# Patient Record
Sex: Female | Born: 1960 | Race: White | Hispanic: No | Marital: Married | Smoking: Former smoker
Health system: Southern US, Community
[De-identification: ages and names within clinical notes are randomized; demographics above are authoritative.]

## PROBLEM LIST (undated history)

## (undated) DIAGNOSIS — D229 Melanocytic nevi, unspecified: Secondary | ICD-10-CM

## (undated) DIAGNOSIS — N951 Menopausal and female climacteric states: Secondary | ICD-10-CM

## (undated) HISTORY — PX: OTHER SURGICAL HISTORY: SHX169

## (undated) HISTORY — DX: Menopausal and female climacteric states: N95.1

## (undated) HISTORY — DX: Melanocytic nevi, unspecified: D22.9

## (undated) HISTORY — PX: BREAST SURGERY: SHX581

## (undated) HISTORY — PX: BUNIONETTE EXCISION: SUR500

---

## 2005-08-11 ENCOUNTER — Encounter: Admission: RE | Admit: 2005-08-11 | Discharge: 2005-08-11 | Payer: Self-pay | Admitting: Internal Medicine

## 2006-08-22 ENCOUNTER — Ambulatory Visit (HOSPITAL_COMMUNITY): Admission: RE | Admit: 2006-08-22 | Discharge: 2006-08-22 | Payer: Self-pay | Admitting: *Deleted

## 2006-10-26 ENCOUNTER — Emergency Department (HOSPITAL_COMMUNITY): Admission: EM | Admit: 2006-10-26 | Discharge: 2006-10-26 | Payer: Self-pay | Admitting: Emergency Medicine

## 2007-01-02 ENCOUNTER — Encounter: Admission: RE | Admit: 2007-01-02 | Discharge: 2007-01-02 | Payer: Self-pay | Admitting: Obstetrics and Gynecology

## 2007-10-15 ENCOUNTER — Ambulatory Visit: Payer: Self-pay | Admitting: Oncology

## 2008-05-07 ENCOUNTER — Encounter: Admission: RE | Admit: 2008-05-07 | Discharge: 2008-05-07 | Payer: Self-pay | Admitting: Obstetrics and Gynecology

## 2010-03-11 ENCOUNTER — Encounter: Admission: RE | Admit: 2010-03-11 | Discharge: 2010-03-11 | Payer: Self-pay | Admitting: Obstetrics and Gynecology

## 2011-01-23 IMAGING — MG MM DIGITAL SCREENING W/ IMPLANTS
8 series · 8 of 8 positions shown · non-contrast
Comparison: none

DG SCREENING W/IMPLANTS
Bilateral CC and MLO view(s) were taken.

DIGITAL SCREENING MAMMOGRAM W/IMPLANTS WITH CAD:
Implants are present in a subpectoral location.  Standard and modified compression views are 
obtained.
The breast tissue is heterogeneously dense.  No dominant masses or malignant type calcifications 
are identified.  Compared with prior studies.
Images were processed with CAD.

[R CC]
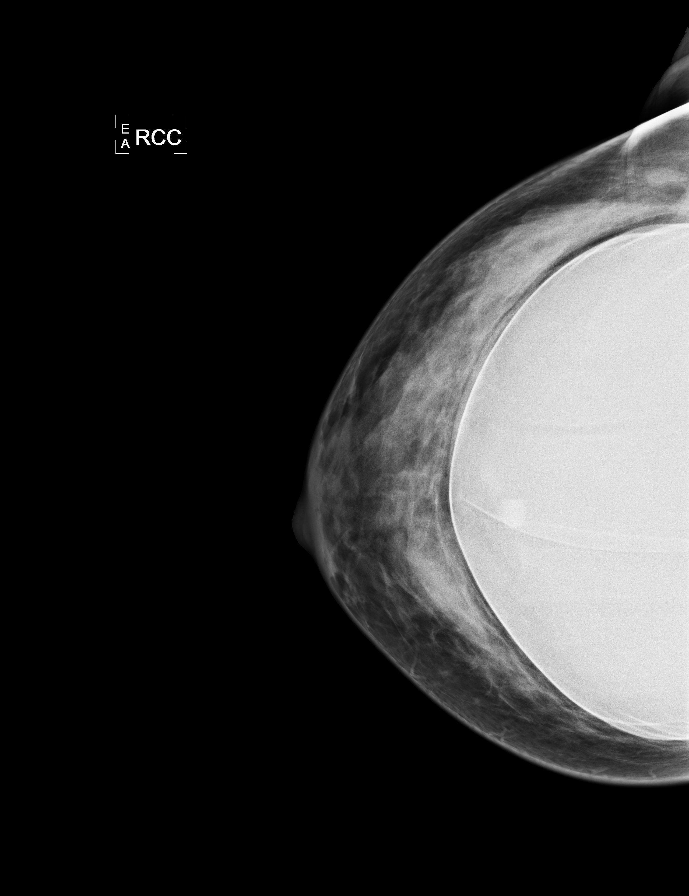

[L CC]
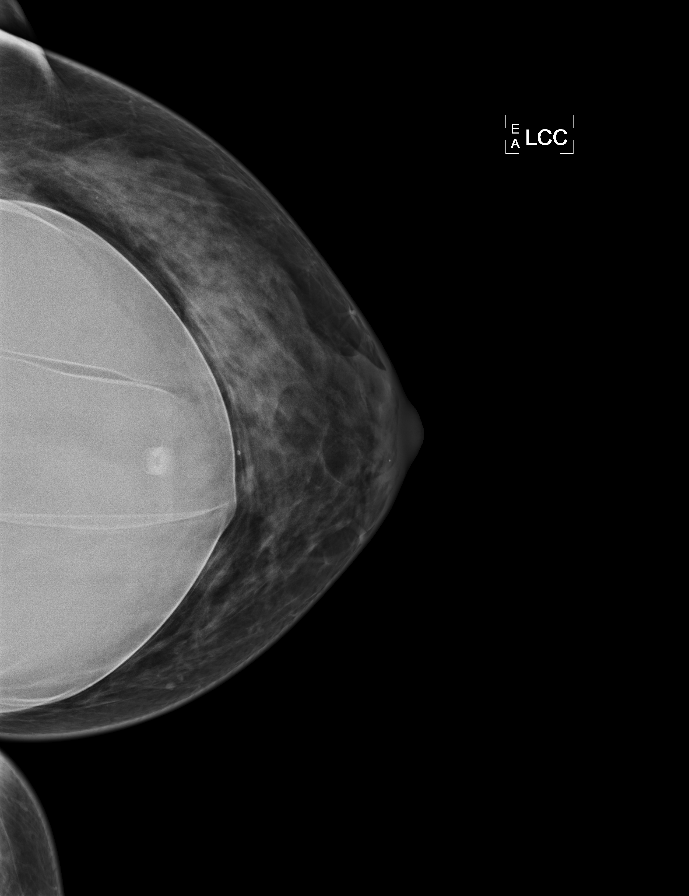

[L MLO]
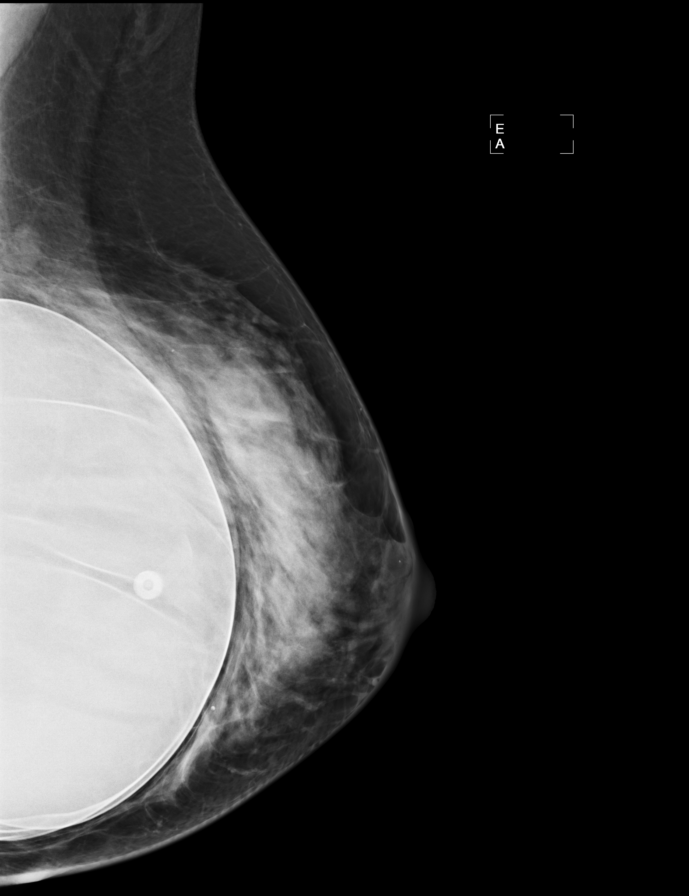

[R MLO]
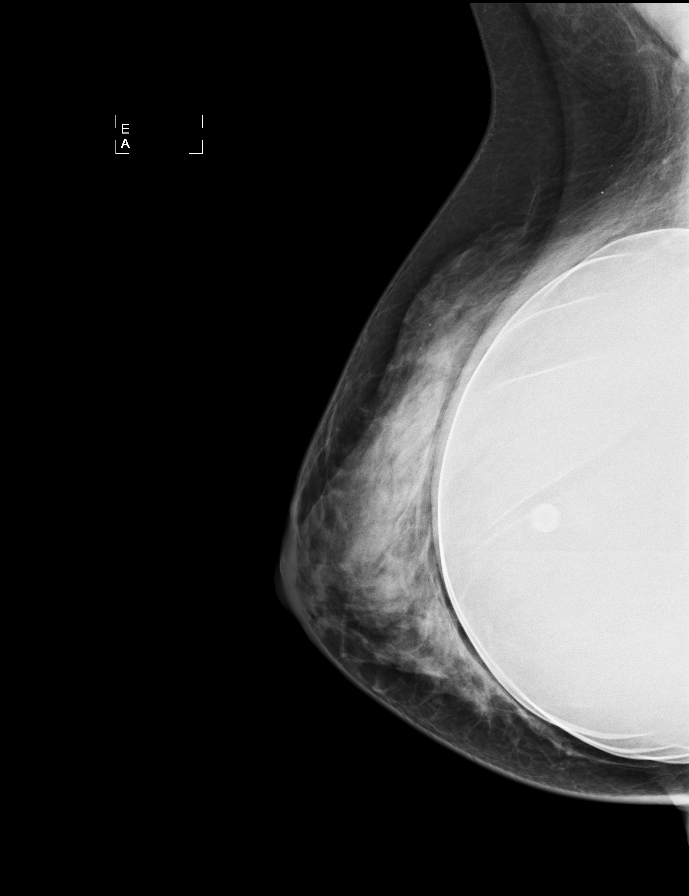

[R CCID]
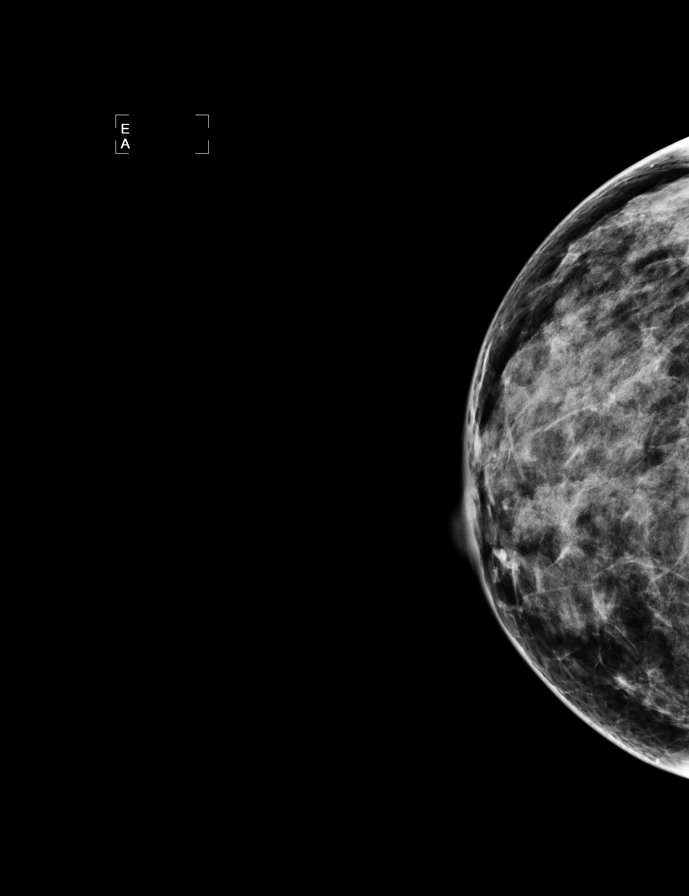

[L CCID]
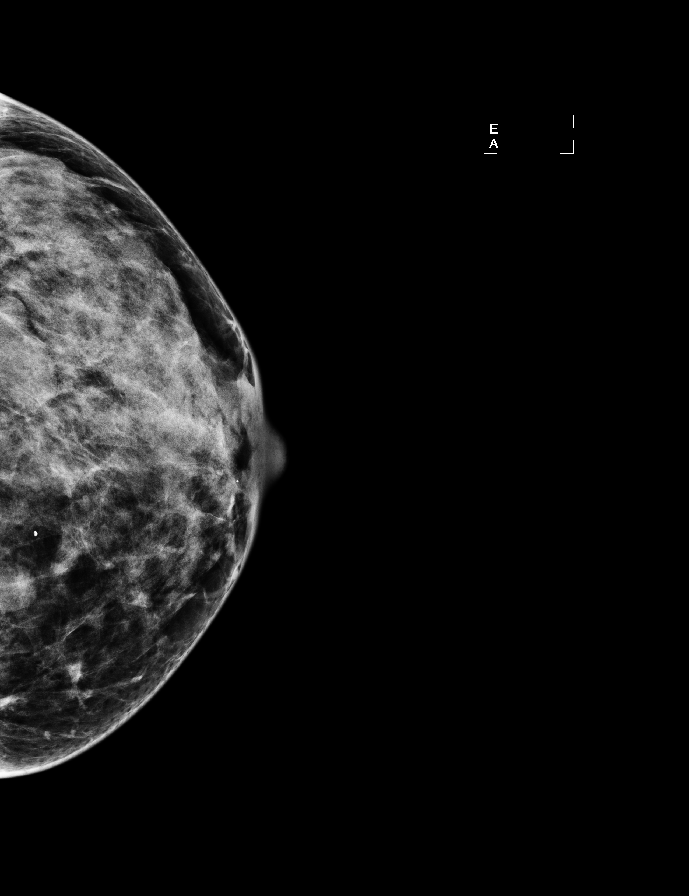

[L MLOID]
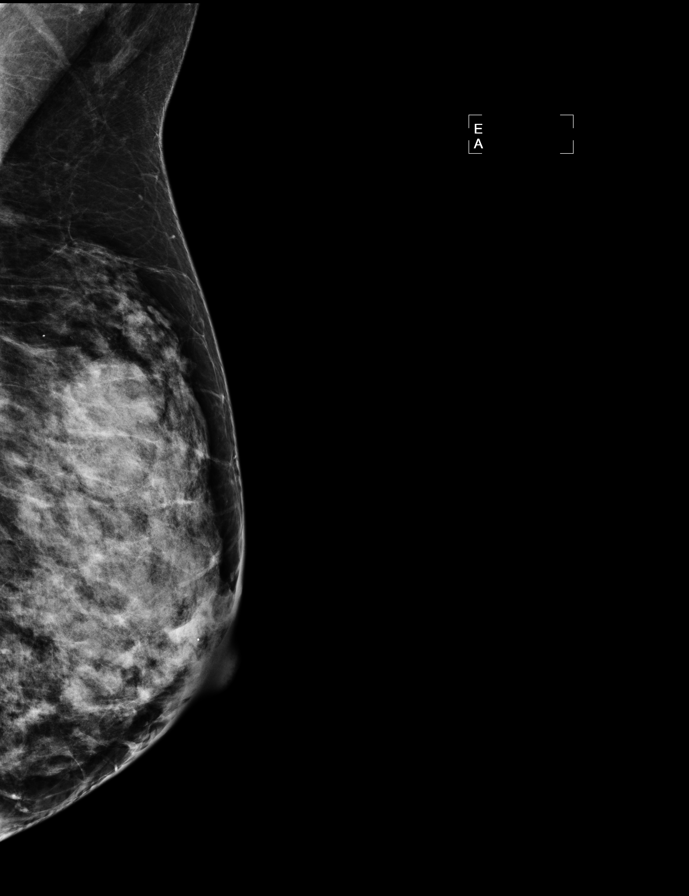

[R MLOID]
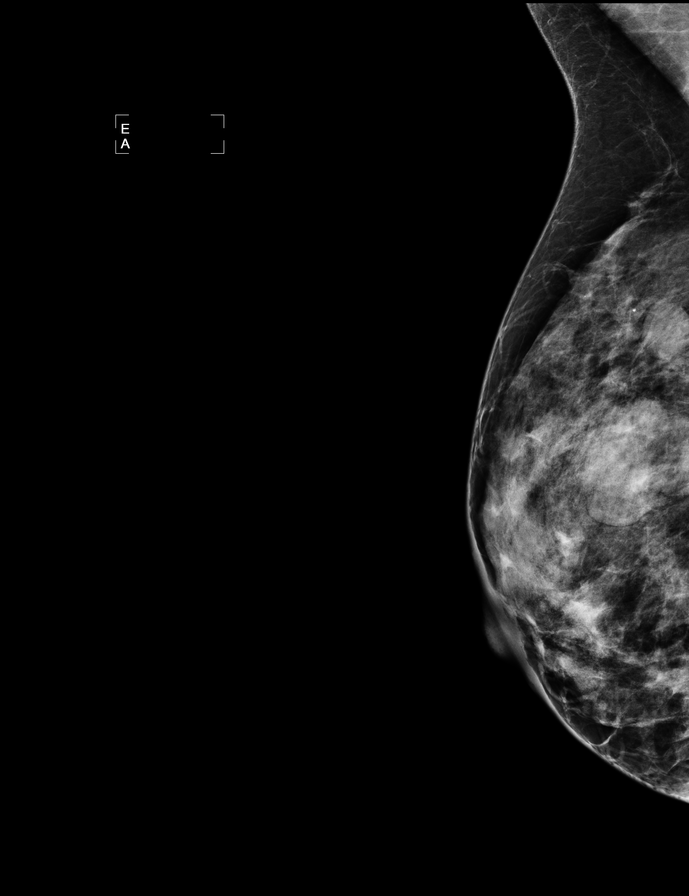

[8 of 8 positions shown; findings below may reference images not displayed]

IMPRESSION: No specific mammographic evidence of malignancy.  Next screening mammogram is recommended in one 
year.

A result letter of this screening mammogram will be mailed directly to the patient.

ASSESSMENT: Negative - BI-RADS 1

Screening mammogram in 1 year.
,

## 2011-06-28 ENCOUNTER — Other Ambulatory Visit (INDEPENDENT_AMBULATORY_CARE_PROVIDER_SITE_OTHER): Payer: Self-pay

## 2011-06-28 ENCOUNTER — Encounter (INDEPENDENT_AMBULATORY_CARE_PROVIDER_SITE_OTHER): Payer: Self-pay

## 2011-06-28 ENCOUNTER — Encounter (INDEPENDENT_AMBULATORY_CARE_PROVIDER_SITE_OTHER): Payer: Self-pay | Admitting: General Surgery

## 2011-06-28 ENCOUNTER — Ambulatory Visit (INDEPENDENT_AMBULATORY_CARE_PROVIDER_SITE_OTHER): Payer: BC Managed Care – PPO | Admitting: General Surgery

## 2011-06-28 VITALS — BP 138/72 | HR 82 | Temp 99.0°F | Ht 66.0 in | Wt 138.0 lb

## 2011-06-28 DIAGNOSIS — M719 Bursopathy, unspecified: Secondary | ICD-10-CM

## 2011-06-28 DIAGNOSIS — M779 Enthesopathy, unspecified: Secondary | ICD-10-CM

## 2011-06-28 DIAGNOSIS — M67919 Unspecified disorder of synovium and tendon, unspecified shoulder: Secondary | ICD-10-CM

## 2011-06-28 DIAGNOSIS — M778 Other enthesopathies, not elsewhere classified: Secondary | ICD-10-CM | POA: Insufficient documentation

## 2011-06-28 NOTE — Progress Notes (Signed)
Subjective:     Patient ID: Laura Graves, female   DOB: April 03, 1961, 50 y.o.   MRN: [id number]  HPI Patient is a pleasant 50 year old female who noticed a mass underneath her left clavicle around one month ago. She sought help at a urgent care facility 2 weeks ago. At that time, a mass was noted below her left clavicle that was soft and mobile. The area was quite tender. She described the pain as constant in the shoulder and radiating up into her left neck and down into her left arm. Since the time she was seen in urgent care this swelling has decreased and is almost gone. She has been doing better and the swelling has gone down since she has decreased her activity. She has tried ice and ibuprofen.  Past Medical History  Diagnosis Date  . Peri-menopause   . Atypical moles     Right foot mole removal    Past Surgical History  Procedure Date  . Bunionette excision     L and R foot bunion removal  . Breast surgery     augmentation  . Uterine ablation     No outpatient encounter prescriptions on file as of 06/28/2011.    Allergies  Allergen Reactions  . Sulfa Antibiotics     Family History  Problem Relation Age of Onset  . Stroke Mother   . Hypertension Mother   . Diabetes Mother   . Hyperlipidemia Mother   . Heart disease Father   . Hypertension Father   . Diabetes Father   . Heart disease Brother   . Cancer Brother     lung cancer, hemochromotosis    History   Social History  . Marital Status: Married    Spouse Name: N/A    Number of Children: N/A  . Years of Education: N/A   Occupational History  . Not on file.   Social History Main Topics  . Smoking status: Former Games developer  . Smokeless tobacco: Not on file  . Alcohol Use: Yes  . Drug Use: No  . Sexually Active:    Other Topics Concern  . Not on file   Social History Narrative  . No narrative on file     Review of Systems  Constitutional:       Night sweats  HENT: Positive for neck pain.   Eyes:  Negative.        Glasses  Respiratory: Negative.   Cardiovascular: Negative.   Gastrointestinal: Negative.   Genitourinary: Negative.   Musculoskeletal: Positive for joint swelling (L shoulder).  Skin:       H/o abnormal moles  Neurological: Negative.   Hematological: Negative.   Psychiatric/Behavioral: Negative.        Objective:   Physical Exam  Constitutional: She is oriented to person, place, and time. She appears well-developed and well-nourished. No distress.  HENT:  Head: Normocephalic and atraumatic.  Mouth/Throat: Oropharynx is clear and moist.  Eyes: Pupils are equal, round, and reactive to light. Right eye exhibits no discharge. No scleral icterus.  Neck: Normal range of motion. Neck supple. No JVD present. No tracheal deviation present. No thyromegaly present.  Cardiovascular: Normal rate and regular rhythm.   Pulmonary/Chest: Effort normal and breath sounds normal. No stridor. No respiratory distress.  Abdominal: Soft. She exhibits no distension. There is no tenderness. There is no rebound.  Musculoskeletal: She exhibits tenderness (L Biceps tendon insertion at corocoid process).  Lymphadenopathy:    She has no cervical adenopathy.  Neurological: She  is alert and oriented to person, place, and time. No cranial nerve deficit. Coordination normal.  Skin: Skin is warm and dry. No rash noted. She is not diaphoretic. No pallor.  Psychiatric: She has a normal mood and affect. Her behavior is normal. Judgment and thought content normal.       Assessment:    L Biceps tendonitis    Plan:    Refer to ortho

## 2012-04-18 ENCOUNTER — Other Ambulatory Visit: Payer: Self-pay | Admitting: Obstetrics and Gynecology

## 2012-04-18 DIAGNOSIS — Z1231 Encounter for screening mammogram for malignant neoplasm of breast: Secondary | ICD-10-CM

## 2012-04-19 ENCOUNTER — Ambulatory Visit
Admission: RE | Admit: 2012-04-19 | Discharge: 2012-04-19 | Disposition: A | Discharge status: Home / Self Care | Payer: Managed Care, Other (non HMO) | Source: Ambulatory Visit | Attending: Obstetrics and Gynecology | Admitting: Obstetrics and Gynecology

## 2012-04-19 DIAGNOSIS — Z1231 Encounter for screening mammogram for malignant neoplasm of breast: Secondary | ICD-10-CM

## 2017-03-27 DIAGNOSIS — R310 Gross hematuria: Secondary | ICD-10-CM | POA: Insufficient documentation

## 2017-03-27 DIAGNOSIS — R3989 Other symptoms and signs involving the genitourinary system: Secondary | ICD-10-CM | POA: Insufficient documentation

## 2017-04-12 ENCOUNTER — Other Ambulatory Visit: Payer: Self-pay | Admitting: Obstetrics and Gynecology

## 2017-04-12 DIAGNOSIS — Z1231 Encounter for screening mammogram for malignant neoplasm of breast: Secondary | ICD-10-CM

## 2017-05-02 ENCOUNTER — Ambulatory Visit: Payer: Managed Care, Other (non HMO)

## 2017-07-09 DIAGNOSIS — N200 Calculus of kidney: Secondary | ICD-10-CM | POA: Insufficient documentation

## 2018-05-03 ENCOUNTER — Encounter: Payer: Self-pay | Admitting: Podiatry

## 2018-05-03 ENCOUNTER — Other Ambulatory Visit: Payer: Self-pay

## 2018-05-03 ENCOUNTER — Ambulatory Visit (INDEPENDENT_AMBULATORY_CARE_PROVIDER_SITE_OTHER): Payer: BLUE CROSS/BLUE SHIELD

## 2018-05-03 ENCOUNTER — Other Ambulatory Visit: Payer: Self-pay | Admitting: Podiatry

## 2018-05-03 ENCOUNTER — Ambulatory Visit (INDEPENDENT_AMBULATORY_CARE_PROVIDER_SITE_OTHER): Payer: BLUE CROSS/BLUE SHIELD | Admitting: Podiatry

## 2018-05-03 VITALS — BP 132/83 | HR 75

## 2018-05-03 DIAGNOSIS — M79672 Pain in left foot: Secondary | ICD-10-CM | POA: Diagnosis not present

## 2018-05-03 DIAGNOSIS — M779 Enthesopathy, unspecified: Principal | ICD-10-CM

## 2018-05-03 DIAGNOSIS — M778 Other enthesopathies, not elsewhere classified: Secondary | ICD-10-CM

## 2018-05-03 DIAGNOSIS — M79671 Pain in right foot: Secondary | ICD-10-CM

## 2018-05-03 MED ORDER — DICLOFENAC SODIUM 75 MG PO TBEC: 75.0000 mg | DELAYED_RELEASE_TABLET | Freq: Two times a day (BID) | ORAL | 2 refills | Status: DC

## 2018-05-03 MED ORDER — TRIAMCINOLONE ACETONIDE 10 MG/ML IJ SUSP
10.0000 mg | Freq: Once | INTRAMUSCULAR | Status: AC
Start: 2018-05-03 — End: 2018-05-03
  Administered 2018-05-03: 10 mg

## 2018-05-07 NOTE — Progress Notes (Signed)
Subjective:   Patient ID: Laura Graves, female   DOB: 57 y.o.   MRN: [id number]   HPI Patient presents for the last several months that she has had pain which is occurred in her forefoot both feet with the left bothering her slightly more than the right but both have been quite tender.  Does not remember any changes in activity and patient is noted to have good digital perfusion and is well oriented x3.  Patient does not smoke and likes to be active   Review of Systems  All other systems reviewed and are negative.       Objective:  Physical Exam  Constitutional: She appears well-developed and well-nourished.  Cardiovascular: Intact distal pulses.  Pulmonary/Chest: Effort normal.  Musculoskeletal: Normal range of motion.  Neurological: She is alert.  Skin: Skin is warm.  Nursing note and vitals reviewed.   Neurovascular status intact muscle strength is adequate range of motion within normal limits with patient noted to have inflammation and pain that is occurring within the second MPJ left over right with slight medial movement of both second toes bilateral.  Patient does not have any other pathology noted     Assessment:  Inflammatory capsulitis second MPJ left over right with fluid buildup and possible mild flexor plate dislocation     Plan:  H&P condition reviewed and will get a focus on the left 1 first.  I did a proximal nerve block of the area I explained risk of aspiration did sterile prep of the toe and then aspirated the second MPJ getting out a small amount of clear fluid and injected with quarter cc dexamethasone Kenalog and applied padding to reduce pressure on the joint surface.  Reappoint the next several weeks and may require long-term orthotics  X-rays indicate slight medial dislocation digit to bilateral but no other pathology was noted

## 2018-05-21 ENCOUNTER — Encounter: Payer: Self-pay | Admitting: Podiatry

## 2018-05-21 ENCOUNTER — Ambulatory Visit (INDEPENDENT_AMBULATORY_CARE_PROVIDER_SITE_OTHER): Payer: BLUE CROSS/BLUE SHIELD | Admitting: Podiatry

## 2018-05-21 DIAGNOSIS — M779 Enthesopathy, unspecified: Secondary | ICD-10-CM | POA: Diagnosis not present

## 2018-05-21 MED ORDER — TRIAMCINOLONE ACETONIDE 10 MG/ML IJ SUSP
10.0000 mg | Freq: Once | INTRAMUSCULAR | Status: AC
Start: 2018-05-21 — End: 2018-05-21
  Administered 2018-05-21: 10 mg

## 2018-05-23 NOTE — Progress Notes (Signed)
Subjective:   Patient ID: Laura Graves, female   DOB: 57 y.o.   MRN: [id number]   HPI Patient presents stating she is getting ready to go to [facility] and her left foot is doing well but the right foot is sore   ROS      Objective:  Physical Exam  Neurovascular status intact with inflammation around the second MPJ right with the left second MPJ doing well     Assessment:  Capsulitis second MPJ right with the right when improved with procedure     Plan:  Proximal nerve block right and I went ahead and aspirated the joint getting out clear fluid injected quarter cc dexamethasone Kenalog and applied padding discussed orthotics and when she returns from her trip we will go over orthotic treatment

## 2018-07-02 ENCOUNTER — Ambulatory Visit (INDEPENDENT_AMBULATORY_CARE_PROVIDER_SITE_OTHER): Payer: BLUE CROSS/BLUE SHIELD | Admitting: Podiatry

## 2018-07-02 ENCOUNTER — Encounter: Payer: Self-pay | Admitting: Podiatry

## 2018-07-02 DIAGNOSIS — M779 Enthesopathy, unspecified: Secondary | ICD-10-CM | POA: Diagnosis not present

## 2018-07-04 NOTE — Progress Notes (Signed)
Subjective:   Patient ID: Laura Graves, female   DOB: 57 y.o.   MRN: [id number]   HPI Patient states that she is feeling quite a bit better and she did very well on her trip and states the pain seems to be getting better and has questions concerning shoe gear and activity levels   ROS      Objective:  Physical Exam  Neurovascular status intact with patient found to have chronic inflammatory condition of the second MPJ left and right which seems to responded well to conservative treatment and immobilization and offloading     Assessment:  Inflammatory condition bilateral capsules which seems to have improved secondary to conservative treatment     Plan:  H&P condition reviewed at great length.  We discussed the possibility ultimately for surgery depending on when symptoms recur or if they recur and I did discuss shortening osteotomies currently.  At this point we will continue with shoe gear modifications padding and probable long-term orthotics depending on response.  Patient will be seen back and is advised to call if any issues should occur

## 2020-05-15 ENCOUNTER — Ambulatory Visit (INDEPENDENT_AMBULATORY_CARE_PROVIDER_SITE_OTHER): Payer: BC Managed Care – PPO | Admitting: Family Medicine

## 2020-05-15 ENCOUNTER — Encounter: Payer: Self-pay | Admitting: Family Medicine

## 2020-05-15 ENCOUNTER — Other Ambulatory Visit: Payer: Self-pay

## 2020-05-15 DIAGNOSIS — M25571 Pain in right ankle and joints of right foot: Secondary | ICD-10-CM

## 2020-05-15 NOTE — Progress Notes (Signed)
Office Visit Note   Patient: Laura Graves           Date of Birth: 1961/02/11           MRN: [id number] Visit Date: 05/15/2020 Requested by: No referring provider defined for this encounter. PCP: System, Pcp Not In  Subjective: Chief Complaint  Patient presents with  . Right Foot - Pain    DOI 05/09/20 - was stepping over a baby gate - hit her heel on the gate, inverting her foot - fell on her rt side, along with the gate. She felt a "stretching" pain. [place]NWB in fx boot w/crutches.  . Right Ankle - Pain    HPI: She is here with right ankle pain.  About 5 days ago she was at the beach, she stepped over a baby gate and tripped, she inverted her right ankle.  She went to a clinic where x-rays were negative for definite fracture although the report mentions calcifications on the dorsum.  She was given a fracture boot and crutches and now presents for evaluation.  She still cannot bear full weight.  No previous problems with her ankle.  She has a chronic history of osteoporosis.              ROS:   All other systems were reviewed and are negative.  Objective: Vital Signs: There were no vitals taken for this visit.  Physical Exam:  General:  Alert and oriented, in no acute distress. Pulm:  Breathing unlabored. Psy:  Normal mood, congruent affect. Skin: There is bruising on the lateral ankle into the lateral foot. Right ankle: No tenderness at the proximal fibula, negative syndesmosis squeeze.  No tenderness over the medial malleolus or the proximal fifth metatarsal.  She is very tender near the ATFL and on the dorsal lateral aspect of the ankle.  No tenderness around the toes.  Toe flexion and extension is intact.  Peroneals are intact.  There is no significant laxity with anterior drawer or talar tilt today.  Imaging: No results found.  Assessment & Plan: 1.  Almost 1 week status post right ankle lateral sprain with possible dorsal  avulsion fracture. -We will treat as a sprain with fracture boot and crutches for the next couple weeks, work on gentle range of motion using pain as guide.  Return in 2 weeks for recheck.  If still having significant tenderness we will repeat three-view ankle x-rays.  If her pain is improving, we will possibly switch to an ASO brace and begin gentle rehab.     Procedures: No procedures performed  No notes on file     PMFS History: Patient Active Problem List   Diagnosis Date Noted  . Recurrent nephrolithiasis 07/09/2017  . Bladder pain 03/27/2017  . Gross hematuria 03/27/2017  . Tendonitis of shoulder, left 06/28/2011   Past Medical History:  Diagnosis Date  . Atypical moles    Right foot mole removal  . Peri-menopause     Family History  Problem Relation Age of Onset  . Stroke Mother   . Hypertension Mother   . Diabetes Mother   . Hyperlipidemia Mother   . Heart disease Father   . Hypertension Father   . Diabetes Father   . Heart disease Brother   . Cancer Brother        lung cancer, hemochromotosis    Past Surgical History:  Procedure Laterality Date  . BREAST SURGERY  augmentation  . BUNIONETTE EXCISION     L and R foot bunion removal  . uterine ablation     Social History   Occupational History  . Not on file  Tobacco Use  . Smoking status: Former Research scientist (life sciences)  . Smokeless tobacco: Never Used  Substance and Sexual Activity  . Alcohol use: Yes  . Drug use: No  . Sexual activity: Not on file

## 2020-05-29 ENCOUNTER — Encounter: Payer: Self-pay | Admitting: Family Medicine

## 2020-05-29 ENCOUNTER — Ambulatory Visit (INDEPENDENT_AMBULATORY_CARE_PROVIDER_SITE_OTHER): Payer: BC Managed Care – PPO | Admitting: Family Medicine

## 2020-05-29 ENCOUNTER — Other Ambulatory Visit: Payer: Self-pay

## 2020-05-29 VITALS — Ht 66.0 in | Wt 138.0 lb

## 2020-05-29 DIAGNOSIS — M25571 Pain in right ankle and joints of right foot: Secondary | ICD-10-CM | POA: Diagnosis not present

## 2020-05-29 NOTE — Progress Notes (Signed)
Office Visit Note   Patient: Laura Graves           Date of Birth: [birth year]           MRN: [id number] Visit Date: 05/29/2020 Requested by: No referring provider defined for this encounter. PCP: System, Pcp Not In  Subjective: Chief Complaint  Patient presents with  . Right Ankle - Follow-up    DOI 04/29/2020. Has been weightbearing in boot with crutches. She does feel better, but still has tenderness top and lateral foot/ankle. Has pain with side to side movement of ankle. No swelling. Tylenol as needed.    HPI: She is here for follow-up almost 3 weeks status post fall resulting in right ankle sprain with possible avulsion fracture on the dorsum.  She is using crutches and a fracture boot, feeling significantly better.  Definitely not pain-free but making progress.  She is hoping to go to the beach in a couple weeks if possible.                ROS:   All other systems were reviewed and are negative.  Objective: Vital Signs: Ht 5\' 6"  (1.676 m)   Wt 138 lb (62.6 kg)   BMI 22.27 kg/m   Physical Exam:  General:  Alert and oriented, in no acute distress. Pulm:  Breathing unlabored. Psy:  Normal mood, congruent affect. Skin: There is resolving ecchymosis on the lateral heel and dorsum of the forefoot near the toes. Right ankle: No tenderness proximal fibula or medial malleolus, no tenderness over the proximal fifth metatarsal.  She remains moderately tender near the ATFL and a little bit of tenderness on the dorsum of the talus.  No laxity with talar tilt, 1+ laxity with anterior drawer.  No tenderness over the lateral malleolus.  Imaging: No results found.  Assessment & Plan: 1.  Clinically healing almost 3-week status post right ankle sprain with possible avulsion fracture -She will wean from crutches as pain permits, wearing her fracture boot for couple more weeks until she feels comfortable enough to switch to a regular shoe. -Thera-Band exercises given today, and she will  begin doing them when pain allows. -In 2 to 3 weeks if still having significant pain she will come back in for recheck.  Otherwise she can follow-up as needed.  Switch to an ASO brace if she still feels unstable after a few weeks.     Procedures: No procedures performed  No notes on file     PMFS History: Patient Active Problem List   Diagnosis Date Noted  . Recurrent nephrolithiasis 07/09/2017  . Bladder pain 03/27/2017  . Gross hematuria 03/27/2017  . Tendonitis of shoulder, left 06/28/2011   Past Medical History:  Diagnosis Date  . Atypical moles    Right foot mole removal  . Peri-menopause     Family History  Problem Relation Age of Onset  . Stroke Mother   . Hypertension Mother   . Diabetes Mother   . Hyperlipidemia Mother   . Heart disease Father   . Hypertension Father   . Diabetes Father   . Heart disease Brother   . Cancer Brother        lung cancer, hemochromotosis    Past Surgical History:  Procedure Laterality Date  . BREAST SURGERY     augmentation  . BUNIONETTE EXCISION     L and R foot bunion removal  . uterine ablation     Social History   Occupational History  .  Not on file  Tobacco Use  . Smoking status: Former Research scientist (life sciences)  . Smokeless tobacco: Never Used  Substance and Sexual Activity  . Alcohol use: Yes  . Drug use: No  . Sexual activity: Not on file

## 2022-06-20 ENCOUNTER — Other Ambulatory Visit: Payer: Self-pay | Admitting: Obstetrics and Gynecology

## 2022-06-20 DIAGNOSIS — Z1231 Encounter for screening mammogram for malignant neoplasm of breast: Secondary | ICD-10-CM

## 2022-07-11 ENCOUNTER — Ambulatory Visit
Admission: RE | Admit: 2022-07-11 | Discharge: 2022-07-11 | Disposition: A | Discharge status: Home / Self Care | Payer: BC Managed Care – PPO | Source: Ambulatory Visit | Attending: Obstetrics and Gynecology | Admitting: Obstetrics and Gynecology

## 2022-07-11 DIAGNOSIS — Z1231 Encounter for screening mammogram for malignant neoplasm of breast: Secondary | ICD-10-CM
